# Patient Record
Sex: Female | Born: 1960 | Race: Black or African American | Hispanic: No | Smoking: Never smoker
Health system: Southern US, Community
[De-identification: ages and names within clinical notes are randomized; demographics above are authoritative.]

## PROBLEM LIST (undated history)

## (undated) DIAGNOSIS — I10 Essential (primary) hypertension: Secondary | ICD-10-CM

---

## 2009-09-18 ENCOUNTER — Emergency Department (HOSPITAL_BASED_OUTPATIENT_CLINIC_OR_DEPARTMENT_OTHER): Admission: EM | Admit: 2009-09-18 | Discharge: 2009-09-18 | Payer: Self-pay | Admitting: Emergency Medicine

## 2009-09-19 ENCOUNTER — Encounter (INDEPENDENT_AMBULATORY_CARE_PROVIDER_SITE_OTHER): Payer: Self-pay | Admitting: Emergency Medicine

## 2009-09-19 ENCOUNTER — Ambulatory Visit: Payer: Self-pay | Admitting: Vascular Surgery

## 2009-09-19 ENCOUNTER — Ambulatory Visit (HOSPITAL_COMMUNITY): Admission: RE | Admit: 2009-09-19 | Discharge: 2009-09-19 | Payer: Self-pay | Admitting: Emergency Medicine

## 2010-03-26 LAB — BASIC METABOLIC PANEL
CO2: 30 mEq/L (ref 19–32)
Chloride: 100 mEq/L (ref 96–112)
GFR calc Af Amer: >60 mL/min (ref >60)
Potassium: 3.7 mEq/L (ref 3.5–5.1)

## 2011-11-14 ENCOUNTER — Encounter (HOSPITAL_BASED_OUTPATIENT_CLINIC_OR_DEPARTMENT_OTHER): Payer: Self-pay

## 2011-11-14 ENCOUNTER — Emergency Department (HOSPITAL_BASED_OUTPATIENT_CLINIC_OR_DEPARTMENT_OTHER)
Admission: EM | Admit: 2011-11-14 | Discharge: 2011-11-15 | Disposition: A | Discharge status: Home / Self Care | Payer: Worker's Compensation | Attending: Emergency Medicine | Admitting: Emergency Medicine

## 2011-11-14 DIAGNOSIS — X503XXA Overexertion from repetitive movements, initial encounter: Secondary | ICD-10-CM | POA: Insufficient documentation

## 2011-11-14 DIAGNOSIS — IMO0002 Reserved for concepts with insufficient information to code with codable children: Secondary | ICD-10-CM | POA: Insufficient documentation

## 2011-11-14 DIAGNOSIS — Z79899 Other long term (current) drug therapy: Secondary | ICD-10-CM | POA: Insufficient documentation

## 2011-11-14 DIAGNOSIS — S76219A Strain of adductor muscle, fascia and tendon of unspecified thigh, initial encounter: Secondary | ICD-10-CM

## 2011-11-14 DIAGNOSIS — Y9229 Other specified public building as the place of occurrence of the external cause: Secondary | ICD-10-CM | POA: Insufficient documentation

## 2011-11-14 DIAGNOSIS — I10 Essential (primary) hypertension: Secondary | ICD-10-CM | POA: Insufficient documentation

## 2011-11-14 DIAGNOSIS — Y990 Civilian activity done for income or pay: Secondary | ICD-10-CM | POA: Insufficient documentation

## 2011-11-14 HISTORY — DX: Essential (primary) hypertension: I10

## 2011-11-14 NOTE — ED Notes (Signed)
I completed a urine drug screen for patient's workers comp requirement.

## 2011-11-14 NOTE — ED Notes (Signed)
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 Reports that she had no pain until she pulled on the mix. Pain with ambulation.

## 2011-11-14 NOTE — ED Provider Notes (Signed)
History   This chart was scribed for Hanley Seamen, MD by Albertha Ghee Rifaie. This patient was seen in room MH01/MH01 and the patient's care was started at 11:35 PM.    CSN: [id number]  Arrival date & time 11/14/11  2228   None     Chief Complaint  Patient presents with  . Muscle pain      The history is provided by the patient. No language interpreter was used.   Rhonda Velez is a 51 y.o. female who presents to the Emergency Department complaining of moderate to severe, localized, constant muscle pain on the right groin fold attributed to an injury while working. Pain is worsen with movement and applying pressure on the area. Patient reports taking muscle relaxer with some improvement. She states that she does a lot of lifting and pulling at work. She denies fever, chills, emesis, and nausea. She denies smoking.     Past Medical History  Diagnosis Date  . Hypertension     History reviewed. No pertinent past surgical history.  No family history on file.  History  Substance Use Topics  . Smoking status: Never Smoker   . Smokeless tobacco: Not on file  . Alcohol Use:    No OB history provided   Review of Systems  All other systems reviewed and are negative.    Allergies  Review of patient's allergies indicates no known allergies.  Home Medications   Current Outpatient Rx  Name  Route  Sig  Dispense  Refill  . CYCLOBENZAPRINE HCL 10 MG PO TABS   Oral   Take 10 mg by mouth 3 (three) times daily as needed.         Marland Kitchen HYDROCODONE-ACETAMINOPHEN 5-500 MG PO TABS   Oral   Take 1 tablet by mouth every 6 (six) hours as needed.         Marland Kitchen LOSARTAN POTASSIUM 25 MG PO TABS   Oral   Take 25 mg by mouth daily.           BP 119/84  Pulse 76  Temp 98.1 F (36.7 C) (Oral)  Resp 16  SpO2 100%  Physical Exam  Constitutional: She appears well-developed and well-nourished. No distress.  HENT:  Head: Normocephalic and atraumatic.  Eyes: Conjunctivae normal and  EOM are normal. Pupils are equal, round, and reactive to light.  Neck: Normal range of motion. Neck supple.  Cardiovascular: Normal rate and regular rhythm.   Pulmonary/Chest: Effort normal and breath sounds normal.  Genitourinary:       Tenderness to right groin fold. No feel of hernia. No hernia when she performs valsalva maneuver.    ED Course  Procedures (including critical care time)  DIAGNOSTIC STUDIES: Oxygen Saturation is 100% on room air, normal  by my interpretation.    COORDINATION OF CARE: 11:48 PM Discussed treatment plan with pt at bedside and pt agreed to plan.    1. Groin strain       MDM  I personally performed the services described in this documentation, which was scribed in my presence.  The recorded information has been reviewed and considered.     Hanley Seamen, MD 11/15/11 [phone]

## 2011-12-23 ENCOUNTER — Encounter (HOSPITAL_BASED_OUTPATIENT_CLINIC_OR_DEPARTMENT_OTHER): Payer: Self-pay | Admitting: Family Medicine

## 2011-12-23 ENCOUNTER — Emergency Department (HOSPITAL_BASED_OUTPATIENT_CLINIC_OR_DEPARTMENT_OTHER)
Admission: EM | Admit: 2011-12-23 | Discharge: 2011-12-23 | Disposition: A | Discharge status: Home / Self Care | Payer: Worker's Compensation | Attending: Emergency Medicine | Admitting: Emergency Medicine

## 2011-12-23 ENCOUNTER — Emergency Department (HOSPITAL_BASED_OUTPATIENT_CLINIC_OR_DEPARTMENT_OTHER): Payer: Worker's Compensation

## 2011-12-23 DIAGNOSIS — M65839 Other synovitis and tenosynovitis, unspecified forearm: Secondary | ICD-10-CM | POA: Insufficient documentation

## 2011-12-23 DIAGNOSIS — Z79899 Other long term (current) drug therapy: Secondary | ICD-10-CM | POA: Insufficient documentation

## 2011-12-23 DIAGNOSIS — I10 Essential (primary) hypertension: Secondary | ICD-10-CM | POA: Insufficient documentation

## 2011-12-23 DIAGNOSIS — M779 Enthesopathy, unspecified: Secondary | ICD-10-CM

## 2011-12-23 NOTE — ED Provider Notes (Signed)
History     CSN: [id number]  Arrival date & time 12/23/11  1810   First MD Initiated Contact with Patient 12/23/11 1829      Chief Complaint  Patient presents with  . Wrist Pain    (Consider location/radiation/quality/duration/timing/severity/associated sxs/prior treatment) HPI Comments: Pt c/o of bilateral wrist pain over the last couple of months after starting her job:pt states states that she does a lot of repetitive lifting:pt states that a bumb came up on her left wrist today:pt denies any redness to the area:pt denies any previous injury  The history is provided by the patient. No language interpreter was used.    Past Medical History  Diagnosis Date  . Hypertension     History reviewed. No pertinent past surgical history.  No family history on file.  History  Substance Use Topics  . Smoking status: Never Smoker   . Smokeless tobacco: Not on file  . Alcohol Use: No    OB History    Grav Para Term Preterm Abortions TAB SAB Ect Mult Living                  Review of Systems  Constitutional: Negative.   Respiratory: Negative.   Cardiovascular: Negative.     Allergies  Review of patient's allergies indicates no known allergies.  Home Medications   Current Outpatient Rx  Name  Route  Sig  Dispense  Refill  . CYCLOBENZAPRINE HCL 10 MG PO TABS   Oral   Take 10 mg by mouth 3 (three) times daily as needed.         Marland Kitchen HYDROCODONE-ACETAMINOPHEN 5-500 MG PO TABS   Oral   Take 1 tablet by mouth every 6 (six) hours as needed.         Marland Kitchen LOSARTAN POTASSIUM 25 MG PO TABS   Oral   Take 25 mg by mouth daily.           BP 145/77  Pulse 91  Temp 97.7 F (36.5 C) (Oral)  Resp 16  Ht 5\' 7"  (1.702 m)  Wt 139 lb (63.05 kg)  BMI 21.77 kg/m2  SpO2 100%  LMP 11/24/2011  Physical Exam  Nursing note and vitals reviewed. Constitutional: She is oriented to person, place, and time. She appears well-developed and well-nourished.  HENT:  Head:  Normocephalic and atraumatic.  Eyes: EOM are normal. Pupils are equal, round, and reactive to light.  Cardiovascular: Normal rate and regular rhythm.   Pulmonary/Chest: Effort normal and breath sounds normal.  Musculoskeletal: Normal range of motion.       Pt has fluctuant dime sized area to the palmar surface of the left inside wrist  Neurological: She is alert and oriented to person, place, and time. She exhibits normal muscle tone. Coordination normal.  Skin: Skin is warm and dry.  Psychiatric: She has a normal mood and affect.    ED Course  Procedures (including critical care time)  Labs Reviewed - No data to display Dg Wrist Complete Left  12/23/2011  *RADIOLOGY REPORT*  Clinical Data: Bilateral wrist pain for weeks due to repetitive lifting, popped left wrist today  LEFT WRIST - COMPLETE 3+ VIEW  Comparison: None.  Findings: Bone mineralization normal. Joint spaces preserved. No fracture, dislocation, or bone destruction.  IMPRESSION: Normal exam.   Original Report Authenticated By: Ulyses Southward, M.D.      1. Tendonitis       MDM  Pt given bilateral wrist splints to help:exam consistent with ganglion cyst:pt  given ortho followup        Teressa Lower, NP 12/23/11 2137

## 2011-12-23 NOTE — ED Notes (Signed)
Pt c/o bilateral wrist pain x weeks due to job, repetitive lifting required. Pt also c/o knot "popped up on left wrist today".

## 2011-12-24 NOTE — ED Provider Notes (Signed)
Medical screening examination/treatment/procedure(s) were performed by non-physician practitioner and as supervising physician I was immediately available for consultation/collaboration.  John-Adam Fields Oros, M.D.     John-Adam Shayana Hornstein, MD 12/24/11 2114 

## 2012-01-27 ENCOUNTER — Encounter (HOSPITAL_BASED_OUTPATIENT_CLINIC_OR_DEPARTMENT_OTHER): Payer: Self-pay | Admitting: *Deleted

## 2012-01-27 ENCOUNTER — Emergency Department (HOSPITAL_BASED_OUTPATIENT_CLINIC_OR_DEPARTMENT_OTHER)
Admission: EM | Admit: 2012-01-27 | Discharge: 2012-01-27 | Disposition: A | Discharge status: Home / Self Care | Payer: Worker's Compensation | Attending: Emergency Medicine | Admitting: Emergency Medicine

## 2012-01-27 DIAGNOSIS — M545 Low back pain, unspecified: Secondary | ICD-10-CM | POA: Insufficient documentation

## 2012-01-27 DIAGNOSIS — I10 Essential (primary) hypertension: Secondary | ICD-10-CM | POA: Insufficient documentation

## 2012-01-27 DIAGNOSIS — Z79899 Other long term (current) drug therapy: Secondary | ICD-10-CM | POA: Insufficient documentation

## 2012-01-27 DIAGNOSIS — M549 Dorsalgia, unspecified: Secondary | ICD-10-CM

## 2012-01-27 MED ORDER — METHOCARBAMOL 500 MG PO TABS: 500.0000 mg | ORAL_TABLET | Freq: Two times a day (BID) | ORAL | Status: DC

## 2012-01-27 MED ORDER — HYDROCODONE-ACETAMINOPHEN 5-325 MG PO TABS: 2.0000 | ORAL_TABLET | ORAL | Status: AC | PRN

## 2012-01-27 NOTE — ED Provider Notes (Signed)
Medical screening examination/treatment/procedure(s) were performed by non-physician practitioner and as supervising physician I was immediately available for consultation/collaboration.   Anis Degidio B. Caydon Feasel, MD 01/27/12 2322 
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
## 2012-01-27 NOTE — ED Notes (Signed)
Sharp pain in her lower back after lifting heavy object at work last night.

## 2012-01-27 NOTE — ED Provider Notes (Signed)
History     CSN: [id number]  Arrival date & time 01/27/12  1553   First MD Initiated Contact with Patient 01/27/12 1627      Chief Complaint  Patient presents with  . Back Pain    (Consider location/radiation/quality/duration/timing/severity/associated sxs/prior treatment) Patient is a 52 y.o. female presenting with back pain. The history is provided by the patient. No language interpreter was used.  Back Pain  This is a new problem. The current episode started yesterday. The problem occurs constantly. The problem has been gradually worsening. The pain is associated with no known injury. The pain is present in the lumbar spine. The quality of the pain is described as aching. The pain does not radiate. The pain is at a severity of 6/10. The pain is moderate. Stiffness is present all day. She has tried nothing for the symptoms. The treatment provided moderate relief. Risk factors: none.   Pt complains of low back pain after lifting at work yesterday Past Medical History  Diagnosis Date  . Hypertension     History reviewed. No pertinent past surgical history.  No family history on file.  History  Substance Use Topics  . Smoking status: Never Smoker   . Smokeless tobacco: Not on file  . Alcohol Use: No    OB History    Grav Para Term Preterm Abortions TAB SAB Ect Mult Living                  Review of Systems  Musculoskeletal: Positive for back pain.  All other systems reviewed and are negative.    Allergies  Review of patient's allergies indicates no known allergies.  Home Medications   Current Outpatient Rx  Name  Route  Sig  Dispense  Refill  . CYCLOBENZAPRINE HCL 10 MG PO TABS   Oral   Take 10 mg by mouth 3 (three) times daily as needed.         Marland Kitchen HYDROCODONE-ACETAMINOPHEN 5-500 MG PO TABS   Oral   Take 1 tablet by mouth every 6 (six) hours as needed.         Marland Kitchen LOSARTAN POTASSIUM 25 MG PO TABS   Oral   Take 25 mg by mouth daily.           BP  131/75  Pulse 85  Temp 98.5 F (36.9 C) (Oral)  Resp 20  SpO2 99%  Physical Exam  Nursing note and vitals reviewed. Constitutional: She is oriented to person, place, and time. She appears well-developed and well-nourished.  HENT:  Head: Normocephalic and atraumatic.  Eyes: Conjunctivae normal are normal. Pupils are equal, round, and reactive to light.  Neck: Normal range of motion.  Cardiovascular: Normal rate.   Pulmonary/Chest: Effort normal.  Abdominal: Soft.  Musculoskeletal: Normal range of motion.  Neurological: She is alert and oriented to person, place, and time.  Skin: Skin is warm.  Psychiatric: She has a normal mood and affect.    ED Course  Procedures (including critical care time)  Labs Reviewed - No data to display No results found.   No diagnosis found.    MDM  Robaxin  And hydrocodone       Lonia Skinner [place], [place] 01/27/12 1726

## 2012-10-10 ENCOUNTER — Other Ambulatory Visit: Payer: Self-pay | Admitting: Obstetrics & Gynecology

## 2012-10-10 DIAGNOSIS — N920 Excessive and frequent menstruation with regular cycle: Secondary | ICD-10-CM

## 2012-10-10 DIAGNOSIS — D259 Leiomyoma of uterus, unspecified: Secondary | ICD-10-CM

## 2014-01-31 IMAGING — CR DG WRIST COMPLETE 3+V*L*
4 series · 4 of 4 positions shown · non-contrast
Comparison: None.

CLINICAL DATA: Bilateral wrist pain for weeks due to repetitive
lifting, popped left wrist today

LEFT WRIST - COMPLETE 3+ VIEW

[x wrist pa left]
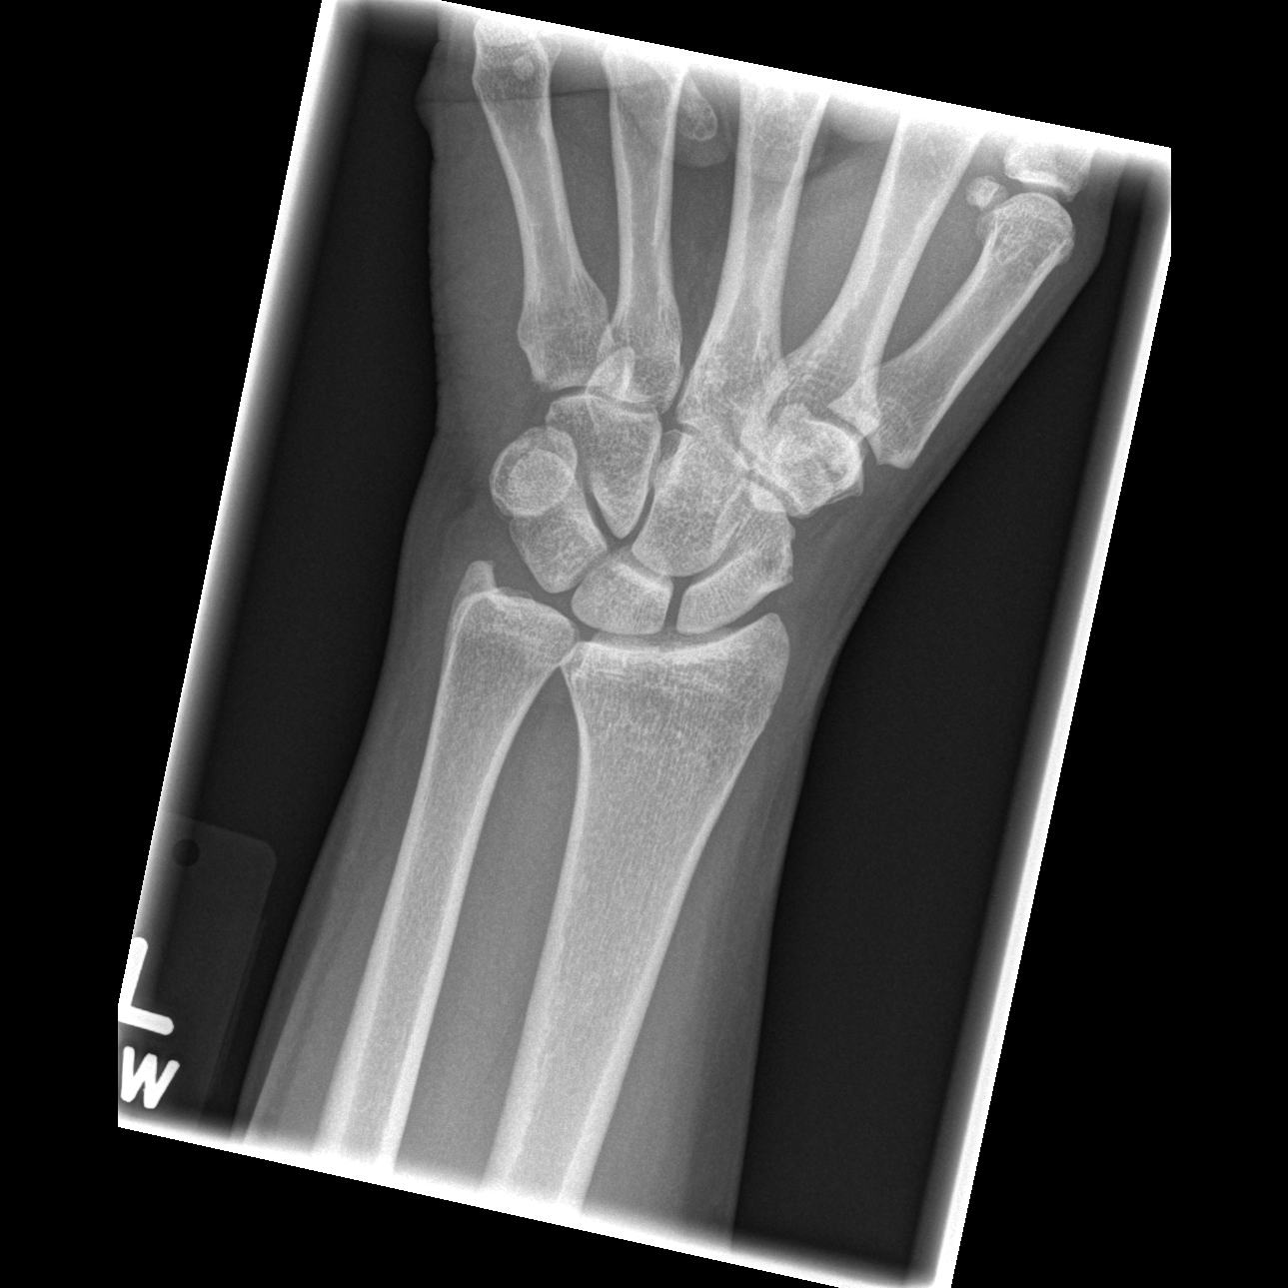

[x wrist obl left]
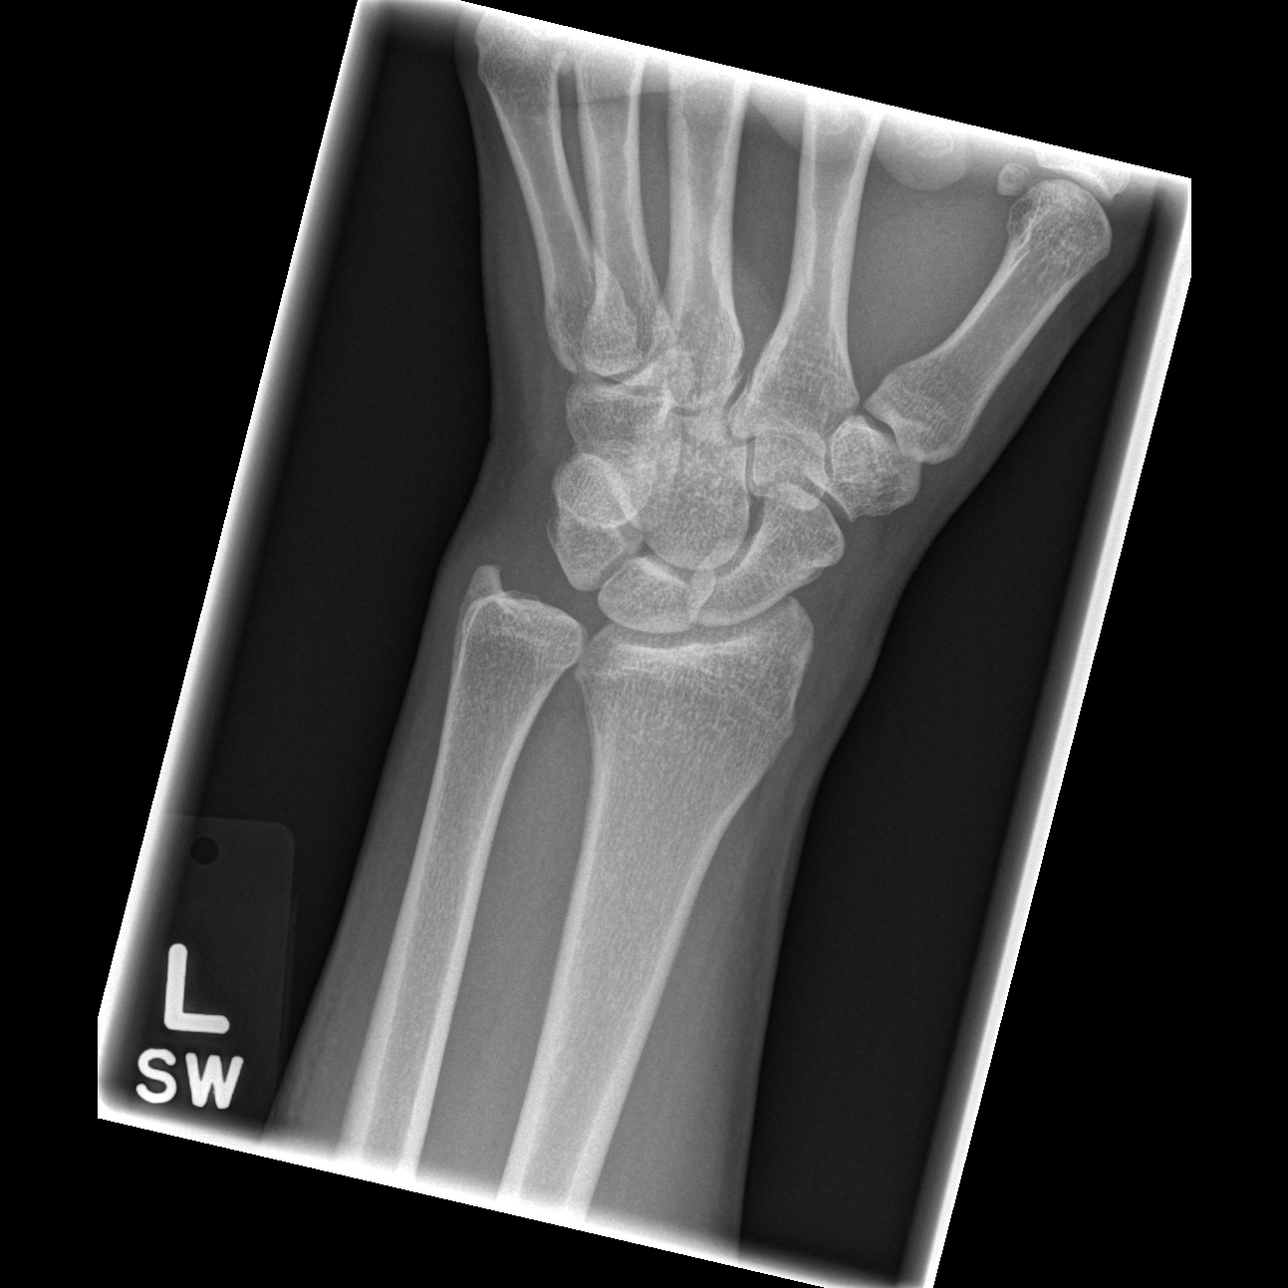

[x wrist lat left]
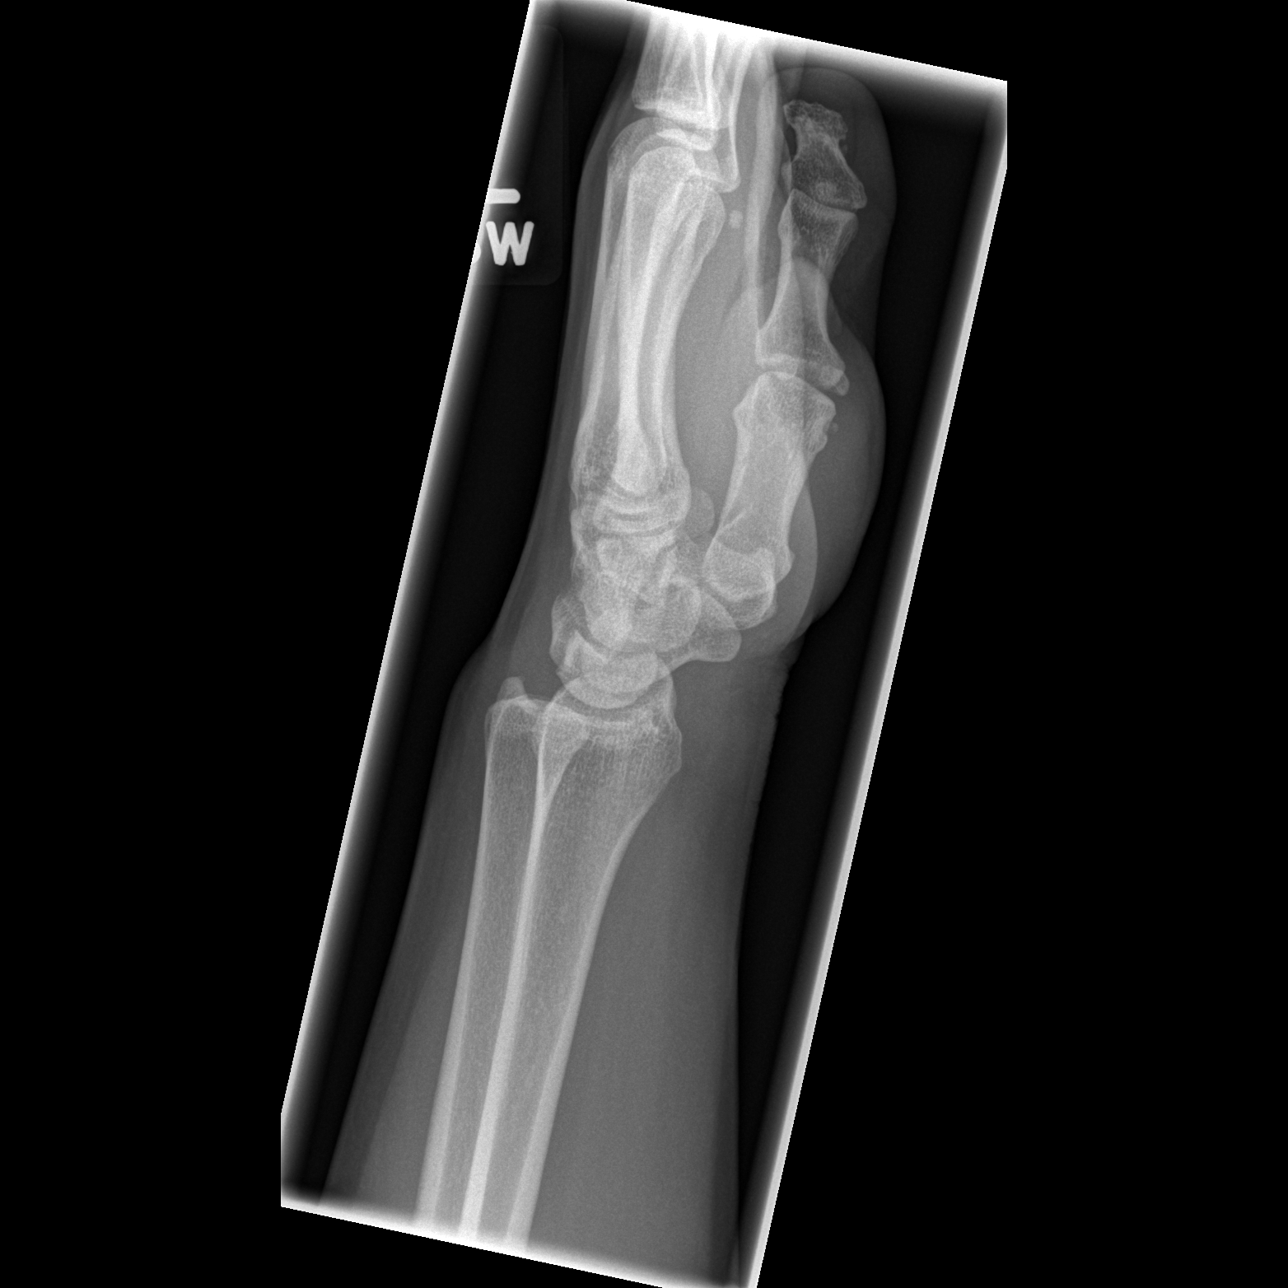

[x navicular]
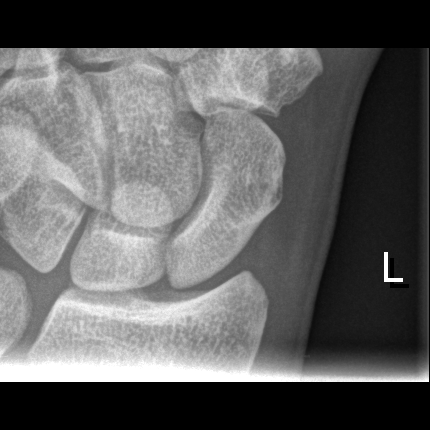

[4 of 4 positions shown; findings below may reference images not displayed]

FINDINGS: Bone mineralization normal.
Joint spaces preserved.
No fracture, dislocation, or bone destruction.
IMPRESSION: Normal exam.

## 2018-12-13 ENCOUNTER — Other Ambulatory Visit: Payer: Self-pay

## 2018-12-13 DIAGNOSIS — Z20822 Contact with and (suspected) exposure to covid-19: Secondary | ICD-10-CM

## 2018-12-15 ENCOUNTER — Telehealth: Payer: Self-pay | Admitting: Family Medicine

## 2018-12-15 LAB — NOVEL CORONAVIRUS, NAA: SARS-CoV-2, NAA: NOT DETECTED

## 2018-12-15 NOTE — Telephone Encounter (Signed)
Negative COVID results given. Patient results "NOT Detected." Caller expressed understanding. ° °

## 2020-09-05 ENCOUNTER — Other Ambulatory Visit: Payer: Self-pay

## 2020-09-05 ENCOUNTER — Ambulatory Visit (INDEPENDENT_AMBULATORY_CARE_PROVIDER_SITE_OTHER): Payer: BC Managed Care – PPO | Admitting: Podiatry

## 2020-09-05 ENCOUNTER — Ambulatory Visit (INDEPENDENT_AMBULATORY_CARE_PROVIDER_SITE_OTHER): Payer: BC Managed Care – PPO

## 2020-09-05 ENCOUNTER — Encounter: Payer: Self-pay | Admitting: Podiatry

## 2020-09-05 DIAGNOSIS — M21619 Bunion of unspecified foot: Secondary | ICD-10-CM | POA: Diagnosis not present

## 2020-09-05 DIAGNOSIS — M216X1 Other acquired deformities of right foot: Secondary | ICD-10-CM

## 2020-09-05 DIAGNOSIS — M216X9 Other acquired deformities of unspecified foot: Secondary | ICD-10-CM | POA: Diagnosis not present

## 2020-09-05 DIAGNOSIS — M2041 Other hammer toe(s) (acquired), right foot: Secondary | ICD-10-CM | POA: Diagnosis not present

## 2020-09-05 NOTE — Progress Notes (Signed)
Subjective:   Patient ID: Rhonda Velez, female   DOB: 60 y.o.   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  States she is tried wider shoes she is tried soaks and other modalities and does take medication for recently diagnosed lupus and is doing well with that.  Patient does not smoke likes to be active   Review of Systems  All other systems reviewed and are negative.      Objective:  Physical Exam Vitals and nursing note reviewed.  Constitutional:      Appearance: She is well-developed.  Pulmonary:     Effort: Pulmonary effort is normal.  Musculoskeletal:        General: Normal range of motion.  Skin:    General: Skin is warm.  Neurological:     Mental Status: She is alert.    Neurovascular status intact muscle strength adequate range of motion within normal limits.  Patient is found to have significant structural bunion deformity of the right foot with reduced range of motion spurring dorsally and small skin manifestations of the pressure occurring against the bone spurs.  Painful when pressed with elevated rigidly contracted second digit chronic keratotic lesion subsecond metatarsal with pain and digital deformity of the fourth and fifth toes distal on the fourth.  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  I do think that forefoot reconstruction right is necessary and I reviewed distal osteotomy with bone spur resection possible excision of the  skin manifestations along with digital fusion digit to right shortening osteotomy and hammertoe repair fourth and fifth digits.  She wants to do consent form today as she needs to get this done as soon as possible due to her schedule and I did allow her to read consent form going over alternative treatments complications.  She understands no guarantee of success of surgery she understands all risk and that this is a lot of forefoot procedures but she should do well long-term but total recovery will take 6 to 9 months.  She signed consent form was given all preoperative instructions and air fracture walker to use during the postoperative period and is encouraged to call with questions concerns which may arise prior to procedures  X-rays indicate elevation of the intermetatarsal angle right narrowing of the joint surface bone spur formation rigid contracture digit to right and elongated second metatarsal.  Fourth and fifth digits do's show rotational component

## 2020-10-24 ENCOUNTER — Ambulatory Visit (INDEPENDENT_AMBULATORY_CARE_PROVIDER_SITE_OTHER): Payer: BC Managed Care – PPO | Admitting: Podiatry

## 2020-10-24 ENCOUNTER — Other Ambulatory Visit: Payer: Self-pay

## 2020-10-24 ENCOUNTER — Encounter: Payer: Self-pay | Admitting: Podiatry

## 2020-10-24 DIAGNOSIS — M2041 Other hammer toe(s) (acquired), right foot: Secondary | ICD-10-CM | POA: Diagnosis not present

## 2020-10-24 DIAGNOSIS — M21619 Bunion of unspecified foot: Secondary | ICD-10-CM

## 2020-10-24 DIAGNOSIS — M216X9 Other acquired deformities of unspecified foot: Secondary | ICD-10-CM | POA: Diagnosis not present

## 2020-10-24 NOTE — Progress Notes (Signed)
Subjective:   Patient ID: Rhonda Velez, female   DOB: 60 y.o.   MRN: [id number]   HPI Patient presents stating she is having a lot of pain in her right foot.  Patient has chronic structural bunion deformity rigid contracture digit to right with dorsal keratotic lesion lesions on the fourth and fifth toes along with pain underneath the second metatarsal.  This is been present for a long time and patient has tried different padding therapies soaks therapy and anti-inflammatories without relief.  The symptoms are quite intense and she is frustrated because she wants to be able to get this foot fixed as we had planned.  Patient has tried different treatment options over the last couple of years   ROS      Objective:  Physical Exam  Neurovascular status intact with structural deformity the first metatarsal right with prominent bone structure and limited motion with moderate arthritis in the joint with rigid contracture digit to right plantarflexed second metatarsal with keratotic lesion chronic in nature second right and digital deformities of the fourth and fifth digit right foot.  We discussed the different things she is done and again she is tried wider shoes mesh materials soaks and different padding systems to offload weight off the joint surface without success     Assessment:  Chronic structural deformity right with failure to respond to numerous conservative treatments over the past several years     Plan:  Reviewed condition debrided lesions and went ahead and continue cushioning with mesh material.  Surgical intervention will be necessary and hopefully we can get this done soon for her due to her schedule

## 2020-11-03 ENCOUNTER — Encounter: Payer: BC Managed Care – PPO | Admitting: Podiatry

## 2020-12-08 ENCOUNTER — Ambulatory Visit: Payer: BC Managed Care – PPO | Admitting: Podiatry

## 2020-12-12 ENCOUNTER — Encounter: Payer: Self-pay | Admitting: Podiatry

## 2020-12-12 ENCOUNTER — Other Ambulatory Visit: Payer: Self-pay

## 2020-12-12 ENCOUNTER — Ambulatory Visit (INDEPENDENT_AMBULATORY_CARE_PROVIDER_SITE_OTHER): Payer: BC Managed Care – PPO | Admitting: Podiatry

## 2020-12-12 DIAGNOSIS — M216X9 Other acquired deformities of unspecified foot: Secondary | ICD-10-CM | POA: Diagnosis not present

## 2020-12-12 DIAGNOSIS — M2041 Other hammer toe(s) (acquired), right foot: Secondary | ICD-10-CM | POA: Diagnosis not present

## 2020-12-12 DIAGNOSIS — M21619 Bunion of unspecified foot: Secondary | ICD-10-CM

## 2020-12-12 NOTE — Progress Notes (Signed)
Subjective:   Patient ID: Rhonda Velez, female   DOB: 60 y.o.   MRN: [id number]   HPI Patient presents stating that she continues to have a lot of pain in the right foot.  States that she has been trying different paddings for her toes has been trying different trimming techniques that we have done shoe gear modifications and soaks without relief of symptoms and states that the pain is still as intense as it was and she is here for surgical consult for correction   ROS      Objective:  Physical Exam  Neurovascular status intact with patient found to have significant structural bunion deformity right with hallux limitus bone spur formation limited to motion elevated second toe right rigid contracture inflammation pain of the second metatarsal phalangeal joint right with keratotic lesion and keratotic lesions the fourth and fifth digit right painful when pressed with history of conservative care has not been successful     Assessment:  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  Reviewed the consent form for surgical intervention to include biplanar osteotomy right first metatarsal shortening osteotomy second right digital fusion digit to right and arthroplasty digits 4 and 5 distal on the fourth.  I reviewed at great length the surgical correction recovery and this is what she wants done and she is scheduled for outpatient surgery after intensive review.  I reviewed her x-rays indicating elevation of her 1 to intermetatarsal angle of approximate 15 degrees rigid contracture digit to right with elongated second metatarsal and rotated fourth and fifth toes on the right foot on x-ray evaluation right foot

## 2020-12-16 ENCOUNTER — Telehealth: Payer: Self-pay

## 2020-12-16 NOTE — Telephone Encounter (Signed)
Rhonda Velez called to cancel her surgery with Dr. Paulla Dolly on 01/06/2021. She stated she does not qualify for short term disability and will need to hold off. She will call me once she gets coverage. Notified Dr. Paulla Dolly and Caren Griffins with [facility].

## 2021-01-14 ENCOUNTER — Encounter: Payer: BC Managed Care – PPO | Admitting: Podiatry

## 2023-12-27 ENCOUNTER — Encounter (HOSPITAL_BASED_OUTPATIENT_CLINIC_OR_DEPARTMENT_OTHER): Payer: Self-pay

## 2023-12-27 ENCOUNTER — Emergency Department (HOSPITAL_BASED_OUTPATIENT_CLINIC_OR_DEPARTMENT_OTHER)

## 2023-12-27 ENCOUNTER — Emergency Department (HOSPITAL_BASED_OUTPATIENT_CLINIC_OR_DEPARTMENT_OTHER)
Admission: EM | Admit: 2023-12-27 | Discharge: 2023-12-28 | Disposition: A | Attending: Emergency Medicine | Admitting: Emergency Medicine

## 2023-12-27 ENCOUNTER — Other Ambulatory Visit: Payer: Self-pay

## 2023-12-27 DIAGNOSIS — Z79899 Other long term (current) drug therapy: Secondary | ICD-10-CM | POA: Diagnosis not present

## 2023-12-27 DIAGNOSIS — J101 Influenza due to other identified influenza virus with other respiratory manifestations: Secondary | ICD-10-CM

## 2023-12-27 DIAGNOSIS — J40 Bronchitis, not specified as acute or chronic: Secondary | ICD-10-CM | POA: Diagnosis not present

## 2023-12-27 DIAGNOSIS — I10 Essential (primary) hypertension: Secondary | ICD-10-CM | POA: Diagnosis not present

## 2023-12-27 DIAGNOSIS — R509 Fever, unspecified: Secondary | ICD-10-CM | POA: Diagnosis present

## 2023-12-27 LAB — RESP PANEL BY RT-PCR (RSV, FLU A&B, COVID)  RVPGX2
Influenza A by PCR: POSITIVE — AB
Influenza B by PCR: NEGATIVE
Resp Syncytial Virus by PCR: NEGATIVE
SARS Coronavirus 2 by RT PCR: NEGATIVE

## 2023-12-27 MED ORDER — IBUPROFEN 400 MG PO TABS
600.0000 mg | ORAL_TABLET | Freq: Once | ORAL | Status: AC
  Administered 2023-12-28: 600 mg via ORAL
  Filled 2023-12-27: qty 1

## 2023-12-27 MED ORDER — AMOXICILLIN 500 MG PO CAPS: 1000.0000 mg | ORAL_CAPSULE | Freq: Three times a day (TID) | ORAL | 0 refills | Status: AC

## 2023-12-27 MED ORDER — PREDNISONE 10 MG PO TABS: ORAL_TABLET | ORAL | 0 refills | Status: AC

## 2023-12-27 MED ORDER — BENZONATATE 100 MG PO CAPS: 200.0000 mg | ORAL_CAPSULE | Freq: Three times a day (TID) | ORAL | 0 refills | Status: AC | PRN

## 2023-12-27 MED ORDER — BENZONATATE 100 MG PO CAPS
200.0000 mg | ORAL_CAPSULE | Freq: Once | ORAL | Status: AC
  Administered 2023-12-28: 200 mg via ORAL
  Filled 2023-12-27: qty 2

## 2023-12-27 MED ORDER — AZITHROMYCIN 250 MG PO TABS: 250.0000 mg | ORAL_TABLET | Freq: Every day | ORAL | 0 refills | Status: AC

## 2023-12-27 MED ORDER — ACETAMINOPHEN 325 MG PO TABS
650.0000 mg | ORAL_TABLET | Freq: Once | ORAL | Status: AC
  Administered 2023-12-27: 20:00:00 650 mg via ORAL
  Filled 2023-12-27: qty 2

## 2023-12-27 MED ORDER — PREDNISONE 20 MG PO TABS
40.0000 mg | ORAL_TABLET | Freq: Once | ORAL | Status: AC
  Administered 2023-12-28: 40 mg via ORAL
  Filled 2023-12-27: qty 2

## 2023-12-27 NOTE — ED Provider Notes (Incomplete)
 [place] [facility] Provider Note   CSN: [id number] Arrival date & time: 12/27/23  1927     Patient presents with: Cough   Rhonda Velez is a 63 y.o. female with history of lupus, hypertension, presents with concern for a mild productive cough, body aches, fever and chills for the past week.  She denies any abdominal pain, nausea, or vomiting.  Reports that she is coughing up a clear sputum.  Also reports some nasal congestion.  {Add pertinent medical, surgical, social history, OB history to HPI:32947}  Cough      Prior to Admission medications  Medication Sig Start Date End Date Taking? Authorizing Provider  albuterol (VENTOLIN HFA) 108 (90 Base) MCG/ACT inhaler Inhale into the lungs. 10/23/19   [provider]  cyclobenzaprine (FLEXERIL) 10 MG tablet Take 10 mg by mouth 3 (three) times daily as needed.    [provider]  folic acid (FOLVITE) 1 MG tablet Take by mouth. 01/23/20   [provider]  gabapentin (NEURONTIN) 100 MG capsule Take 1 capsule by mouth daily. 09/03/19   [provider]  HYDROcodone -acetaminophen  (VICODIN) 5-500 MG per tablet Take 1 tablet by mouth every 6 (six) hours as needed.    [provider]  hydroxychloroquine (PLAQUENIL) 200 MG tablet Take 200 mg by mouth 2 (two) times daily. 09/01/20   [provider]  losartan (COZAAR) 25 MG tablet Take 25 mg by mouth daily.    [provider]  losartan-hydrochlorothiazide (HYZAAR) 50-12.5 MG tablet Take 1 tablet by mouth daily. 04/03/15   [provider]  methotrexate (RHEUMATREX) 2.5 MG tablet Take by mouth. 07/29/20   [provider]  omeprazole (PRILOSEC) 40 MG capsule Take by mouth. 06/14/16   [provider]  phentermine (ADIPEX-P) 37.5 MG tablet Take 37.5 mg by mouth daily. 08/21/20   [provider]  tiZANidine (ZANAFLEX) 4 MG tablet Take 1-1.5 tabs upto 2x daily as needed. 10/28/19    [provider]  topiramate (TOPAMAX) 50 MG tablet Take 50 mg by mouth at bedtime. 08/21/20   [provider]    Allergies: Patient has no known allergies.    Review of Systems  Respiratory:  Positive for cough.     Updated Vital Signs BP (!) 160/101 (BP Location: Right Arm)   Pulse 88   Temp 98.8 F (37.1 C) (Oral)   Resp 20   Ht 5' 7 (1.702 m)   Wt 63 kg   LMP 11/24/2011   SpO2 100%   BMI 21.75 kg/m   Physical Exam Vitals and nursing note reviewed.  Constitutional:      General: She is not in acute distress.    Appearance: She is well-developed.  HENT:     Head: Normocephalic and atraumatic.     Mouth/Throat:     Pharynx: No oropharyngeal exudate or posterior oropharyngeal erythema.     Comments: Swallowing without difficulty Eyes:     Conjunctiva/sclera: Conjunctivae normal.  Cardiovascular:     Rate and Rhythm: Normal rate and regular rhythm.     Heart sounds: No murmur heard. Pulmonary:     Effort: Pulmonary effort is normal. No respiratory distress.     Breath sounds: Normal breath sounds.     Comments: Mild dry cough on exam Abdominal:     Palpations: Abdomen is soft.     Tenderness: There is no abdominal tenderness.  Musculoskeletal:        General: No swelling.  Cervical back: Neck supple. No rigidity.  Lymphadenopathy:     Cervical: No cervical adenopathy.  Skin:    General: Skin is warm and dry.     Capillary Refill: Capillary refill takes less than 2 seconds.  Neurological:     Mental Status: She is alert.  Psychiatric:        Mood and Affect: Mood normal.     (all labs ordered are listed, but only abnormal results are displayed) Labs Reviewed  RESP PANEL BY RT-PCR (RSV, FLU A&B, COVID)  RVPGX2 - Abnormal; Notable for the following components:      Result Value   Influenza A by PCR POSITIVE (*)    All other components within normal limits    EKG: None  Radiology: DG Chest 2 View Result Date: 12/27/2023 EXAM:  2 VIEW(S) XRAY OF THE CHEST 12/27/2023 8:08 pm COMPARISON: None available. CLINICAL HISTORY: History of cough and congestion. Shallow inspiration. FINDINGS: LUNGS AND PLEURA: Central peribronchial thickening consistent with bronchitic changes. This may represent acute or chronic bronchitis or airway disease in the appropriate clinical setting. No airspace disease or consolidation in the lungs. No pleural effusion or pneumothorax. HEART AND MEDIASTINUM: Heart size and pulmonary vascularity are normal. No acute abnormality of the mediastinal silhouette. BONES AND SOFT TISSUES: Degenerative changes in the spine. Surgical clips in the upper abdomen. No acute osseous abnormality. IMPRESSION: 1. Central peribronchial thickening consistent with bronchitic changes, possibly representing acute or chronic bronchitis or airway disease. 2. No airspace disease or consolidation. Electronically signed by: Elsie Gravely MD 12/27/2023 08:18 PM EST RP Workstation: [id number]    {Document cardiac monitor, telemetry assessment procedure when appropriate:32947} Procedures   Medications Ordered in the ED  acetaminophen  (TYLENOL ) tablet 650 mg (650 mg Oral Given 12/27/23 1954)      {Click here for ABCD2, HEART and other calculators REFRESH Note before signing:1}                              Medical Decision Making Amount and/or Complexity of Data Reviewed Radiology: ordered.  Risk OTC drugs.     Differential diagnosis includes but is not limited to COVID, flu, RSV, viral URI, strep pharyngitis, viral pharyngitis, allergic rhinitis, pneumonia, bronchitis   ED Course:  Upon initial evaluation, patient is well-appearing, no acute distress.  Febrile to 101.6 upon arrival.  Elevated blood pressure 168/117 upon arrival.  Otherwise, normal vitals.  Mild dry cough on exam.  Lungs clear to auscultation bilaterally.  Labs Ordered: I Ordered, and personally interpreted labs.  The pertinent results include:    Influenza A positive.  COVID and RSV negative.  Imaging Studies ordered: I ordered imaging studies including chest x-ray I independently visualized the imaging with scope of interpretation limited to determining acute life threatening conditions related to emergency care. Imaging showed  IMPRESSION:  1. Central peribronchial thickening consistent with bronchitic changes,  possibly representing acute or chronic bronchitis or airway disease.  2. No airspace disease or consolidation.   I agree with the radiologist interpretation   Medications Given: Prednisone  Tessalon  Perles Tylenol   Upon re-evaluation, patient remains with stable vitals.  Her fever has improved to 98.8 upon receiving Tylenol .  Discussed with patient that she tested positive for influenza A.  This seems to be the cause of her cough, congestion, body aches and chills.  She reports her symptoms started a week ago, and I would suspect that symptoms would be improving by this point  or fever resolved.  Chest x-ray shows some peribronchial thickening consistent with bronchitic changes, but no consolidation noted to suggest pneumonia.  She does not have any significant erythema or tonsillar exudate to suggest strep pharyngitis.  Low concern for any other source of infection at this time aside from the influenza A. Patient is outside window for Tamiflu.  I discussed with patient that since she is still febrile even after 7 days of symptoms, this raises my concern for possible pneumonia that may not have been picked up on chest x-ray.  We will start with treatment for her bronchitis with course of prednisone .  I discussed with patient that if she does not feel improvement or does not have resolution of fevers in 2 days, she should start on the prescribed amoxicillin  and azithromycin  for potential pneumonia.  She is in agreement with plan of care.  Stable and appropriate for discharge home.    Impression: Influenza  A Bronchitis  Disposition:  Discharged home with instructions to use over-the-counter medications as needed for symptom control.  Take course of prednisone  as prescribed.  If symptoms not improved by the next 2 days, or she still has a fever, start the amoxicillin  and azithromycin  as prescribed for potential pneumonia.  Stay out of work until fever free for 24 hours without the use of fever reducing medication.  Work note provided.  Follow-up with PCP if symptoms not improving within the next 5 days. Return precautions given and patient verbalized understanding.    Record Review: External records from outside source obtained and reviewed including ***     This chart was dictated using voice recognition software, Dragon. Despite the best efforts of this provider to proofread and correct errors, errors may still occur which can change documentation meaning.    {Document critical care time when appropriate  Document review of labs and clinical decision tools ie CHADS2VASC2, etc  Document your independent review of radiology images and any outside records  Document your discussion with family members, caretakers and with consultants  Document social determinants of health affecting pt's care  Document your decision making why or why not admission, treatments were needed:32947:::1}   Final diagnoses:  None    ED Discharge Orders     None

## 2023-12-27 NOTE — ED Triage Notes (Signed)
 Pt arrives with c/o cough that started about a week ago. Pt reports congestion, headache, fever and bodyaches.

## 2023-12-27 NOTE — ED Provider Notes (Signed)
 [place] [facility] Provider Note   CSN: [id number] Arrival date & time: 12/27/23  1927     Patient presents with: Cough   Rhonda Velez is a 63 y.o. female with history of lupus, hypertension, presents with concern for a mild productive cough, body aches, fever and chills for the past week.  She denies any abdominal pain, nausea, or vomiting.  Reports that she is coughing up a clear sputum.  Also reports some nasal congestion.    Cough      Prior to Admission medications  Medication Sig Start Date End Date Taking? Authorizing Provider  amoxicillin  (AMOXIL ) 500 MG capsule Take 2 capsules (1,000 mg total) by mouth 3 (three) times daily for 5 days. 12/29/23 01/03/24 Yes Veta Palma, PA-C  azithromycin  (ZITHROMAX ) 250 MG tablet Take 1 tablet (250 mg total) by mouth daily. Take first 2 tablets together, then 1 every day until finished. 12/29/23  Yes Veta Palma, PA-C  benzonatate  (TESSALON ) 100 MG capsule Take 2 capsules (200 mg total) by mouth every 8 (eight) hours as needed for cough. 12/27/23  Yes Veta Palma, PA-C  predniSONE  (DELTASONE ) 10 MG tablet Take 4 tablets (40 mg total) by mouth daily with breakfast for 1 day, THEN 3 tablets (30 mg total) daily with breakfast for 2 days, THEN 2 tablets (20 mg total) daily with breakfast for 2 days, THEN 1 tablet (10 mg total) daily with breakfast for 1 day. 12/28/23 01/03/24 Yes Veta Palma, PA-C  albuterol (VENTOLIN HFA) 108 (90 Base) MCG/ACT inhaler Inhale into the lungs. 10/23/19   [provider]  cyclobenzaprine (FLEXERIL) 10 MG tablet Take 10 mg by mouth 3 (three) times daily as needed.    [provider]  folic acid (FOLVITE) 1 MG tablet Take by mouth. 01/23/20   [provider]  gabapentin (NEURONTIN) 100 MG capsule Take 1 capsule by mouth daily. 09/03/19   [provider]  HYDROcodone -acetaminophen  (VICODIN) 5-500 MG per tablet Take 1  tablet by mouth every 6 (six) hours as needed.    [provider]  hydroxychloroquine (PLAQUENIL) 200 MG tablet Take 200 mg by mouth 2 (two) times daily. 09/01/20   [provider]  losartan (COZAAR) 25 MG tablet Take 25 mg by mouth daily.    [provider]  losartan-hydrochlorothiazide (HYZAAR) 50-12.5 MG tablet Take 1 tablet by mouth daily. 04/03/15   [provider]  methotrexate (RHEUMATREX) 2.5 MG tablet Take by mouth. 07/29/20   [provider]  omeprazole (PRILOSEC) 40 MG capsule Take by mouth. 06/14/16   [provider]  phentermine (ADIPEX-P) 37.5 MG tablet Take 37.5 mg by mouth daily. 08/21/20   [provider]  tiZANidine (ZANAFLEX) 4 MG tablet Take 1-1.5 tabs upto 2x daily as needed. 10/28/19   [provider]  topiramate (TOPAMAX) 50 MG tablet Take 50 mg by mouth at bedtime. 08/21/20   [provider]    Allergies: Patient has no known allergies.    Review of Systems  Respiratory:  Positive for cough.     Updated Vital Signs BP (!) 164/92 (BP Location: Right Arm)   Pulse 84   Temp 98.9 F (37.2 C) (Oral)   Resp 20   Ht 5' 7 (1.702 m)   Wt 63 kg   LMP 11/24/2011   SpO2 100%   BMI 21.75 kg/m   Physical Exam Vitals and nursing note reviewed.  Constitutional:      General: She is not in  acute distress.    Appearance: She is well-developed.  HENT:     Head: Normocephalic and atraumatic.     Mouth/Throat:     Pharynx: No oropharyngeal exudate or posterior oropharyngeal erythema.     Comments: Swallowing without difficulty Eyes:     Conjunctiva/sclera: Conjunctivae normal.  Cardiovascular:     Rate and Rhythm: Normal rate and regular rhythm.     Heart sounds: No murmur heard. Pulmonary:     Effort: Pulmonary effort is normal. No respiratory distress.     Breath sounds: Normal breath sounds.     Comments: Mild dry cough on exam Abdominal:     Palpations: Abdomen is soft.     Tenderness:  There is no abdominal tenderness.  Musculoskeletal:        General: No swelling.     Cervical back: Neck supple. No rigidity.  Lymphadenopathy:     Cervical: No cervical adenopathy.  Skin:    General: Skin is warm and dry.     Capillary Refill: Capillary refill takes less than 2 seconds.  Neurological:     Mental Status: She is alert.  Psychiatric:        Mood and Affect: Mood normal.     (all labs ordered are listed, but only abnormal results are displayed) Labs Reviewed  RESP PANEL BY RT-PCR (RSV, FLU A&B, COVID)  RVPGX2 - Abnormal; Notable for the following components:      Result Value   Influenza A by PCR POSITIVE (*)    All other components within normal limits    EKG: None  Radiology: DG Chest 2 View Result Date: 12/27/2023 EXAM: 2 VIEW(S) XRAY OF THE CHEST 12/27/2023 8:08 pm COMPARISON: None available. CLINICAL HISTORY: History of cough and congestion. Shallow inspiration. FINDINGS: LUNGS AND PLEURA: Central peribronchial thickening consistent with bronchitic changes. This may represent acute or chronic bronchitis or airway disease in the appropriate clinical setting. No airspace disease or consolidation in the lungs. No pleural effusion or pneumothorax. HEART AND MEDIASTINUM: Heart size and pulmonary vascularity are normal. No acute abnormality of the mediastinal silhouette. BONES AND SOFT TISSUES: Degenerative changes in the spine. Surgical clips in the upper abdomen. No acute osseous abnormality. IMPRESSION: 1. Central peribronchial thickening consistent with bronchitic changes, possibly representing acute or chronic bronchitis or airway disease. 2. No airspace disease or consolidation. Electronically signed by: Elsie Gravely MD 12/27/2023 08:18 PM EST RP Workstation: [id number]     Procedures   Medications Ordered in the ED  acetaminophen  (TYLENOL ) tablet 650 mg (650 mg Oral Given 12/27/23 1954)  predniSONE  (DELTASONE ) tablet 40 mg (40 mg Oral Given 12/28/23 0009)   benzonatate  (TESSALON ) capsule 200 mg (200 mg Oral Given 12/28/23 0009)  ibuprofen  (ADVIL ) tablet 600 mg (600 mg Oral Given 12/28/23 0009)                                    Medical Decision Making Amount and/or Complexity of Data Reviewed Radiology: ordered.  Risk OTC drugs.     Differential diagnosis includes but is not limited to COVID, flu, RSV, viral URI, strep pharyngitis, viral pharyngitis, allergic rhinitis, pneumonia, bronchitis   ED Course:  Upon initial evaluation, patient is well-appearing, no acute distress.  Febrile to 101.6 upon arrival.  Elevated blood pressure 168/117 upon arrival.  Otherwise, normal vitals.  Mild dry cough on exam.  Lungs clear to auscultation bilaterally.  Labs Ordered: I Ordered, and  personally interpreted labs.  The pertinent results include:   Influenza A positive.  COVID and RSV negative.  Imaging Studies ordered: I ordered imaging studies including chest x-ray I independently visualized the imaging with scope of interpretation limited to determining acute life threatening conditions related to emergency care. Imaging showed  IMPRESSION:  1. Central peribronchial thickening consistent with bronchitic changes,  possibly representing acute or chronic bronchitis or airway disease.  2. No airspace disease or consolidation.   I agree with the radiologist interpretation   Medications Given: Prednisone  Tessalon  Perles Tylenol   Upon re-evaluation, patient remains with stable vitals.  Her fever has improved to 98.8 upon receiving Tylenol .  Discussed with patient that she tested positive for influenza A.  This seems to be the cause of her cough, congestion, body aches and chills.  She reports her symptoms started a week ago, and I would suspect that after 7 days, symptoms would be improving by this point or fever resolved.  Chest x-ray shows some peribronchial thickening consistent with bronchitic changes, but no consolidation noted to  suggest pneumonia.  She does not have any significant erythema or tonsillar exudate to suggest strep pharyngitis.  Low concern for any other source of infection at this time aside from the influenza A. Patient is outside window for Tamiflu.  I discussed with patient that since she is still febrile even after 7 days of symptoms, this raises my concern for possible pneumonia that may not have been picked up on chest x-ray.  We will start with treatment for her bronchitis with course of prednisone .  I discussed with patient that if she does not feel improvement or does not have resolution of fevers in 2 days, she should start on the prescribed amoxicillin  and azithromycin  for potential pneumonia.  She is in agreement with plan of care.  Stable and appropriate for discharge home.    Impression: Influenza A Bronchitis  Disposition:  Discharged home with instructions to use over-the-counter medications as needed for symptom control.  Take course of prednisone  as prescribed.  If symptoms not improved by the next 2 days, or she still has a fever, start the amoxicillin  and azithromycin  as prescribed for potential pneumonia.  Stay out of work until fever free for 24 hours without the use of fever reducing medication.  Work note provided.  Follow-up with PCP if symptoms not improving within the next 5 days. Return precautions given and patient verbalized understanding.   This chart was dictated using voice recognition software, Dragon. Despite the best efforts of this provider to proofread and correct errors, errors may still occur which can change documentation meaning.       Final diagnoses:  Influenza A  Bronchitis    ED Discharge Orders          Ordered    amoxicillin  (AMOXIL ) 500 MG capsule  3 times daily        12/27/23 2358    azithromycin  (ZITHROMAX ) 250 MG tablet  Daily        12/27/23 2358    predniSONE  (DELTASONE ) 10 MG tablet  Q breakfast        12/27/23 2358    benzonatate   (TESSALON ) 100 MG capsule  Every 8 hours PRN        12/27/23 2358               Veta Palma, PA-C 12/28/23 0018    Lenor Hollering, MD 12/28/23 1103

## 2023-12-28 NOTE — Discharge Instructions (Addendum)
 You tested positive for the flu today.  It takes about 7-10 days for symptoms to start improving after onset. Your COVID, RSV are negative.  Your chest x-ray did show signs of bronchitis which is some inflammation of the lung tissue.  It did not pick up on any pneumonia today.   Your illness is contagious and can be spread to others. It cannot be cured by antibiotics or other medicines. Take basic precautions such as washing your hands often, covering your mouth when you cough or sneeze, and avoiding public places where you could spread your illness to others.  Medications prescribed:  You have been prescribed prednisone  which is an anti-inflammatory medication. Please take this medication as prescribed for the next 7 days (40mg  on days 1 and 2, 30mg  on days 3 and 4, 20mg  on days 5 and 6, 10mg  on day 7). Take this medication in the morning with breakfast, as taking it at night may make it hard to sleep.  You were given your first dose here tonight.  Take your next dose tomorrow morning.  If you are a diabetic, please monitor your blood sugars closely on this medication, as it can cause your blood sugar to rise.   You have been prescribed a medication called Tessalon  Perles to help with cough.  You may take this up to every 8 hours as needed for cough.  You were given your first dose here today.  If your symptoms are not starting to improve by Thursday morning, or you still remain with fevers on Thursday, please start taking the below antibiotics to treat for potential pneumonia:  Amoxicillin : Take this 3 times daily for 5 days Azithromycin : Take this once daily for 5 days.  For your first dose, take 2 tablets.  Take 1 tablet daily on the remaining 4 days.   Take the full course of antibiotics even if you start feeling better.  Antibiotics can cause you to have some diarrhea.  This is normal.  If you develop diarrhea, this should resolve shortly after stopping the antibiotic.  Home care instructions:   You can take Tylenol  (acetominophen) and/or Motrin  (Ibuprofen ) as directed on the packaging for fever reduction and pain relief.  If you are taking Tylenol  (acetaminophen ), please check any other over the counter medications you are taking to ensure they do not also contain acetaminophen . Taking too much Tylenol /acetaminophen  can lead to severe liver damage.  You were given a dose of Tylenol  here at 8 PM, your next dose can be no sooner than 2 AM  You were given a dose of ibuprofen  here at midnight.  Your next dose can be no sooner than 6 AM   For cough: honey 1/2 to 1 teaspoon (you can dilute the honey in water or another fluid).  You can also use guaifenesin and dextromethorphan for cough which are over-the-counter medications. You can use a humidifier for chest congestion and cough.  If you don't have a humidifier, you can sit in the bathroom with the hot shower running.      For sore throat: try warm salt water gargles, cepacol lozenges, throat spray, warm tea or water with lemon/honey, popsicles or ice, or OTC cold relief medicine for throat discomfort.    For congestion: Flonase 1-2 sprays in each nostril daily.    It is important to stay hydrated: drink plenty of fluids (water, gatorade/powerade/pedialyte, juices, or teas) to keep your throat moisturized and help further relieve irritation/discomfort.   You may return to normal  activities (work/school) when:  - You are having improvement in symptoms  - AND have had resolution of fever without the use of fever-reducing medications for 24 hours  Follow-up instructions: Please follow-up with your primary care provider for further evaluation of your symptoms if you are not feeling better within the next 5 days.  Your blood pressure is also elevated at 160/101 today.  Please follow-up with your PCP within the next month for further management of your blood pressures.  Return instructions:  Please return to the Emergency Department if you  experience worsening symptoms.  RETURN IMMEDIATELY IF you develop shortness of breath, confusion or altered mental status, a new rash, become dizzy, faint, or poorly responsive, or are unable to be cared for at home. Please return if you have persistent vomiting and cannot keep down fluids or develop a fever that is not controlled by tylenol  or motrin .   Please return if you have any other emergent concerns.

## 2023-12-28 NOTE — ED Notes (Signed)
 Patient transferred from waiting room to ED treatment room. Assuming pt care at this time.
